# Patient Record
Sex: Female | Born: 1935 | Race: White | Hispanic: No
Health system: Southern US, Community
[De-identification: ages and names within clinical notes are randomized; demographics above are authoritative.]

---

## 2018-06-13 ENCOUNTER — Other Ambulatory Visit (HOSPITAL_COMMUNITY): Payer: Self-pay

## 2018-06-13 ENCOUNTER — Inpatient Hospital Stay
Admission: RE | Admit: 2018-06-13 | Discharge: 2018-07-02 | Disposition: A | Discharge status: Skilled Nursing Facility | Payer: Self-pay | Source: Ambulatory Visit | Attending: Internal Medicine | Admitting: Internal Medicine

## 2018-06-13 DIAGNOSIS — J9690 Respiratory failure, unspecified, unspecified whether with hypoxia or hypercapnia: Secondary | ICD-10-CM

## 2018-06-13 DIAGNOSIS — J90 Pleural effusion, not elsewhere classified: Secondary | ICD-10-CM

## 2018-06-13 DIAGNOSIS — Z9889 Other specified postprocedural states: Secondary | ICD-10-CM

## 2018-06-13 LAB — CBC WITH DIFFERENTIAL/PLATELET
BASOS PCT: 0 %
Basophils Absolute: 0 10*3/uL (ref 0.0–0.1)
Eosinophils Absolute: 0 10*3/uL (ref 0.0–0.7)
Eosinophils Relative: 0 %
HCT: 40 % (ref 36.0–46.0)
HEMOGLOBIN: 11.5 g/dL — AB (ref 12.0–15.0)
Lymphocytes Relative: 6 %
Lymphs Abs: 0.9 10*3/uL (ref 0.7–4.0)
MCH: 22.9 pg — ABNORMAL LOW (ref 26.0–34.0)
MCHC: 28.8 g/dL — AB (ref 30.0–36.0)
MCV: 79.7 fL (ref 78.0–100.0)
MONO ABS: 0.9 10*3/uL (ref 0.1–1.0)
Monocytes Relative: 6 %
NEUTROS ABS: 13.2 10*3/uL — AB (ref 1.7–7.7)
Neutrophils Relative %: 88 %
PLATELETS: 345 10*3/uL (ref 150–400)
RBC: 5.02 MIL/uL (ref 3.87–5.11)
RDW: 24 % — AB (ref 11.5–15.5)
WBC: 15 10*3/uL — ABNORMAL HIGH (ref 4.0–10.5)

## 2018-06-13 LAB — BLOOD GAS, ARTERIAL
ACID-BASE EXCESS: 9.7 mmol/L — AB (ref 0.0–2.0)
BICARBONATE: 33.8 mmol/L — AB (ref 20.0–28.0)
FIO2: 0.35
O2 Content: 40 L/min
O2 Saturation: 99.6 %
PCO2 ART: 46.7 mmHg (ref 32.0–48.0)
PH ART: 7.473 — AB (ref 7.350–7.450)
PO2 ART: 162 mmHg — AB (ref 83.0–108.0)
Patient temperature: 98.6

## 2018-06-14 LAB — HEMOGLOBIN A1C
Hgb A1c MFr Bld: 5.8 % — ABNORMAL HIGH (ref 4.8–5.6)
Mean Plasma Glucose: 119.76 mg/dL

## 2018-06-14 LAB — COMPREHENSIVE METABOLIC PANEL
ALBUMIN: 2.2 g/dL — AB (ref 3.5–5.0)
ALK PHOS: 66 U/L (ref 38–126)
ALT: 16 U/L (ref 0–44)
AST: 22 U/L (ref 15–41)
Anion gap: 9 (ref 5–15)
BILIRUBIN TOTAL: 0.8 mg/dL (ref 0.3–1.2)
BUN: 8 mg/dL (ref 8–23)
CO2: 34 mmol/L — AB (ref 22–32)
Calcium: 8 mg/dL — ABNORMAL LOW (ref 8.9–10.3)
Chloride: 97 mmol/L — ABNORMAL LOW (ref 98–111)
Creatinine, Ser: 0.66 mg/dL (ref 0.44–1.00)
GFR calc Af Amer: >60 mL/min (ref >60)
Glucose, Bld: 97 mg/dL (ref 70–99)
POTASSIUM: 3.2 mmol/L — AB (ref 3.5–5.1)
Sodium: 140 mmol/L (ref 135–145)
TOTAL PROTEIN: 4.6 g/dL — AB (ref 6.5–8.1)

## 2018-06-15 LAB — BASIC METABOLIC PANEL
Anion gap: 5 (ref 5–15)
BUN: 11 mg/dL (ref 8–23)
CHLORIDE: 97 mmol/L — AB (ref 98–111)
CO2: 34 mmol/L — ABNORMAL HIGH (ref 22–32)
Calcium: 7.6 mg/dL — ABNORMAL LOW (ref 8.9–10.3)
Creatinine, Ser: 0.7 mg/dL (ref 0.44–1.00)
GFR calc Af Amer: >60 mL/min (ref >60)
GFR calc non Af Amer: >60 mL/min (ref >60)
Glucose, Bld: 104 mg/dL — ABNORMAL HIGH (ref 70–99)
POTASSIUM: 3.4 mmol/L — AB (ref 3.5–5.1)
Sodium: 136 mmol/L (ref 135–145)

## 2018-06-16 LAB — POTASSIUM: Potassium: 3.7 mmol/L (ref 3.5–5.1)

## 2018-06-19 ENCOUNTER — Other Ambulatory Visit (HOSPITAL_COMMUNITY): Payer: Self-pay

## 2018-06-19 LAB — CBC
HCT: 38.3 % (ref 36.0–46.0)
Hemoglobin: 11.5 g/dL — ABNORMAL LOW (ref 12.0–15.0)
MCH: 23.4 pg — ABNORMAL LOW (ref 26.0–34.0)
MCHC: 30 g/dL (ref 30.0–36.0)
MCV: 78 fL (ref 78.0–100.0)
Platelets: 465 10*3/uL — ABNORMAL HIGH (ref 150–400)
RBC: 4.91 MIL/uL (ref 3.87–5.11)
RDW: 25.8 % — AB (ref 11.5–15.5)
WBC: 14.3 10*3/uL — ABNORMAL HIGH (ref 4.0–10.5)

## 2018-06-19 LAB — BASIC METABOLIC PANEL
Anion gap: 11 (ref 5–15)
BUN: 13 mg/dL (ref 8–23)
CALCIUM: 8.2 mg/dL — AB (ref 8.9–10.3)
CO2: 33 mmol/L — ABNORMAL HIGH (ref 22–32)
CREATININE: 0.77 mg/dL (ref 0.44–1.00)
Chloride: 95 mmol/L — ABNORMAL LOW (ref 98–111)
GFR calc Af Amer: >60 mL/min (ref >60)
GFR calc non Af Amer: >60 mL/min (ref >60)
Glucose, Bld: 103 mg/dL — ABNORMAL HIGH (ref 70–99)
Potassium: 2.8 mmol/L — ABNORMAL LOW (ref 3.5–5.1)
SODIUM: 139 mmol/L (ref 135–145)

## 2018-06-20 LAB — POTASSIUM: POTASSIUM: 3.9 mmol/L (ref 3.5–5.1)

## 2018-06-23 ENCOUNTER — Other Ambulatory Visit (HOSPITAL_COMMUNITY): Payer: Self-pay

## 2018-06-23 LAB — BASIC METABOLIC PANEL
Anion gap: 7 (ref 5–15)
BUN: 17 mg/dL (ref 8–23)
CALCIUM: 8.3 mg/dL — AB (ref 8.9–10.3)
CHLORIDE: 96 mmol/L — AB (ref 98–111)
CO2: 37 mmol/L — AB (ref 22–32)
Creatinine, Ser: 0.7 mg/dL (ref 0.44–1.00)
GFR calc non Af Amer: >60 mL/min (ref >60)
GLUCOSE: 93 mg/dL (ref 70–99)
POTASSIUM: 2.7 mmol/L — AB (ref 3.5–5.1)
Sodium: 140 mmol/L (ref 135–145)

## 2018-06-24 LAB — POTASSIUM: POTASSIUM: 3.7 mmol/L (ref 3.5–5.1)

## 2018-06-25 LAB — BASIC METABOLIC PANEL
ANION GAP: 10 (ref 5–15)
BUN: 17 mg/dL (ref 8–23)
CHLORIDE: 93 mmol/L — AB (ref 98–111)
CO2: 36 mmol/L — ABNORMAL HIGH (ref 22–32)
Calcium: 8.3 mg/dL — ABNORMAL LOW (ref 8.9–10.3)
Creatinine, Ser: 0.82 mg/dL (ref 0.44–1.00)
GFR calc non Af Amer: >60 mL/min (ref >60)
GLUCOSE: 94 mg/dL (ref 70–99)
POTASSIUM: 3.7 mmol/L (ref 3.5–5.1)
Sodium: 139 mmol/L (ref 135–145)

## 2018-06-30 ENCOUNTER — Other Ambulatory Visit (HOSPITAL_COMMUNITY): Payer: Self-pay

## 2018-07-02 ENCOUNTER — Other Ambulatory Visit (HOSPITAL_COMMUNITY): Payer: Self-pay

## 2018-07-02 NOTE — Progress Notes (Signed)
Patient ID: Carolyn Herrera, female   DOB: 10-03-1931, 84 y.o.   MRN: [id number]  Select Dr Manson PasseyBrown requesting imaging review with Dr Grace IsaacWatts  Pt has existing PleurX catheter on left CXR revealed B pleural effusions Pt is asymptomatic PleurX is working well-- "sometimes need to manipulate pt position for  effusion to respond to PleurX" per RN No fever No pain No complaints  Dr Manson PasseyBrown asking if any intervention is needed  Dr Grace IsaacWatts has reviewed imaging No recommendation for intervention Stable catheter as date of admission  Dr Manson PasseyBrown aware-- appreciates IR help

## 2018-11-09 DEATH — deceased

## 2020-03-13 IMAGING — DX DG CHEST 1V PORT
1 series · 1 of 1 positions shown · non-contrast
Comparison: 06/19/2018

CLINICAL DATA: Respiratory failure.  Chest tube

EXAM:
PORTABLE CHEST 1 VIEW

[chest ap]
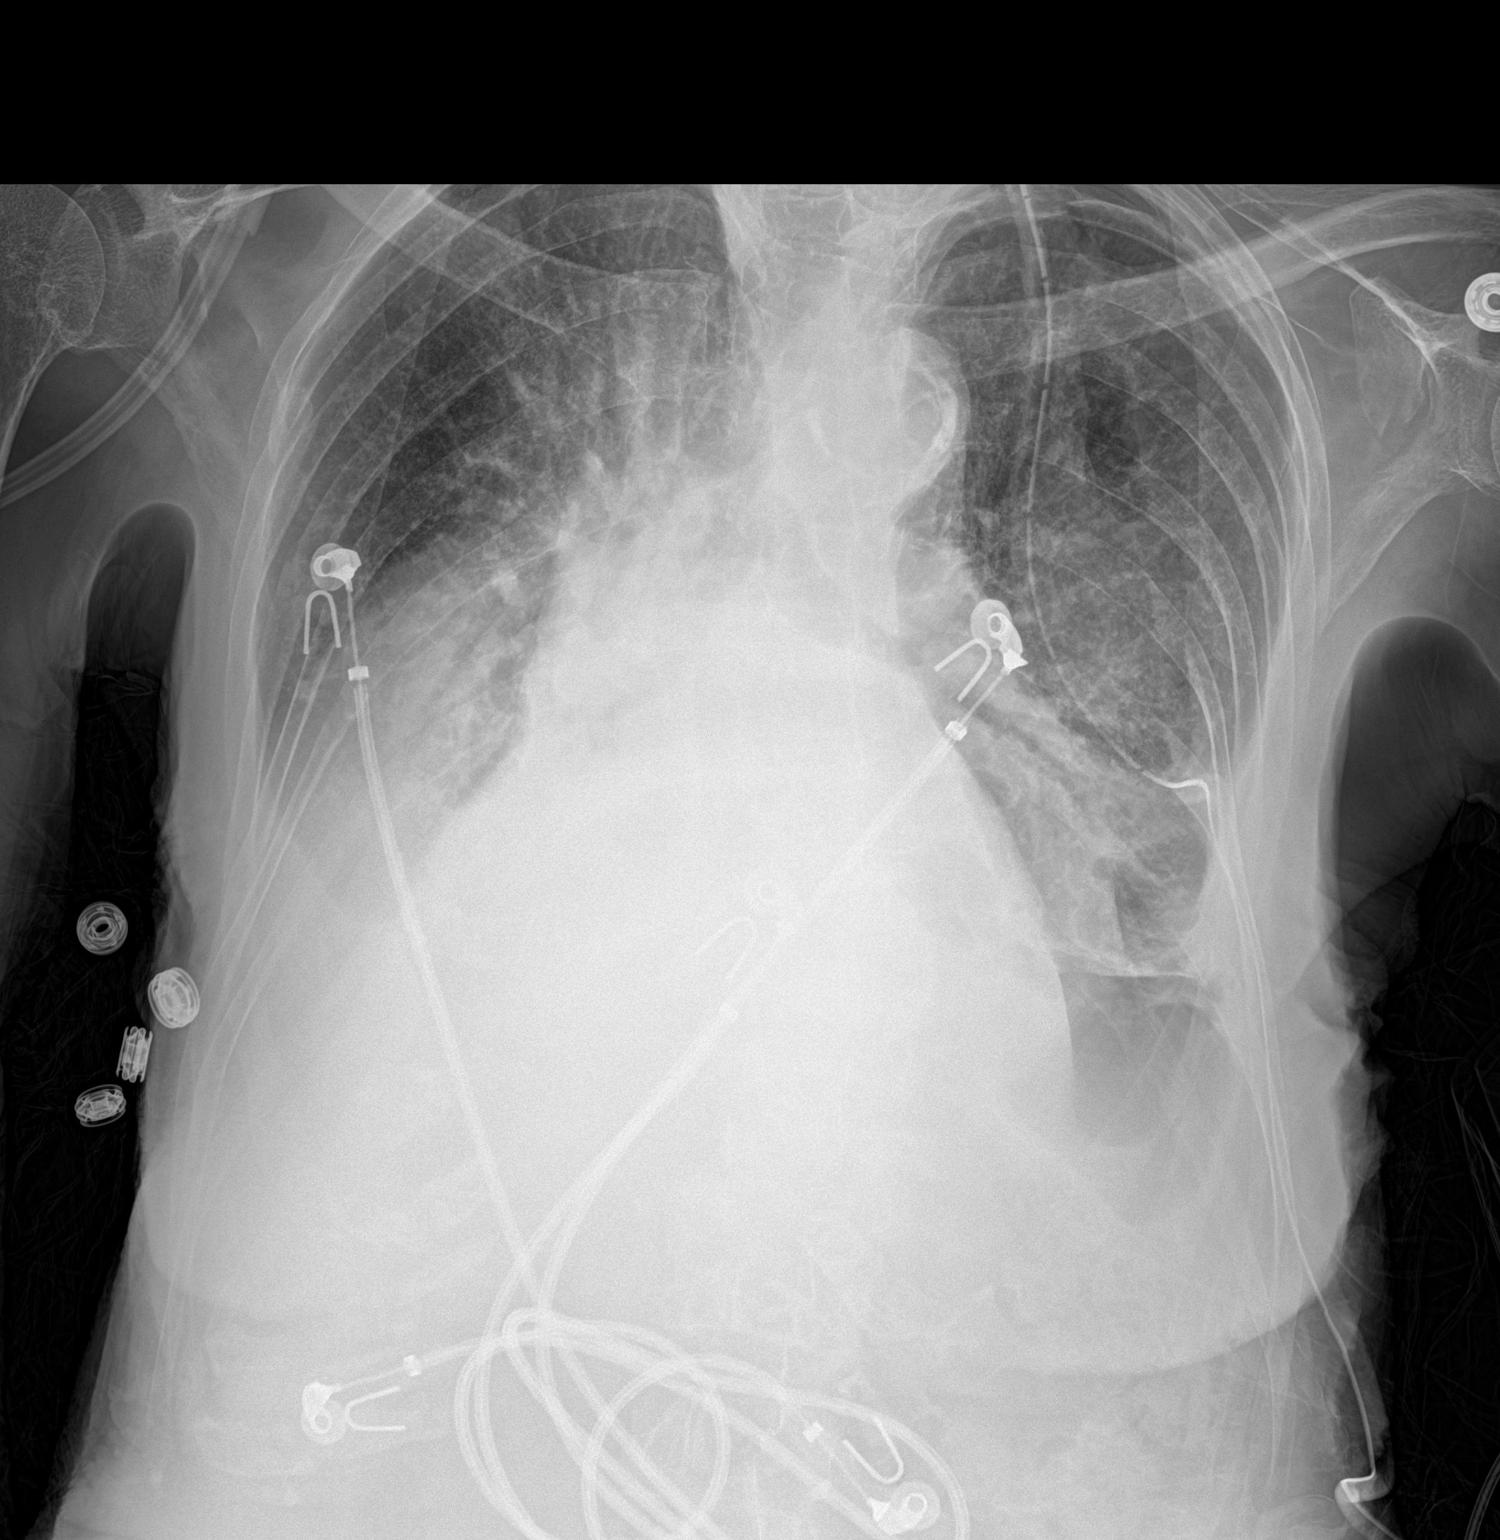

[1 of 1 positions shown; findings below may reference images not displayed]

FINDINGS: Left chest tube remains in place.  Tiny left apical pneumothorax.

Mild improvement in bibasilar airspace disease and bilateral pleural
effusions. Atherosclerotic aorta.
IMPRESSION: Tiny left apical pneumothorax remains with left chest tube in place

Interval improvement in bibasilar airspace disease and bilateral
effusions.
